# Patient Record
Sex: Male | Born: 1980 | Race: White | Hispanic: No | Marital: Single | Smoking: Current every day smoker
Health system: Southern US, Community
[De-identification: ages and names within clinical notes are randomized; demographics above are authoritative.]

## PROBLEM LIST (undated history)

## (undated) DIAGNOSIS — E785 Hyperlipidemia, unspecified: Secondary | ICD-10-CM

---

## 2015-11-03 ENCOUNTER — Emergency Department (HOSPITAL_COMMUNITY): Payer: Self-pay

## 2015-11-03 ENCOUNTER — Encounter (HOSPITAL_COMMUNITY): Payer: Self-pay | Admitting: Oncology

## 2015-11-03 ENCOUNTER — Emergency Department (HOSPITAL_COMMUNITY)
Admission: EM | Admit: 2015-11-03 | Discharge: 2015-11-03 | Disposition: A | Discharge status: Home / Self Care | Payer: Self-pay | Attending: Emergency Medicine | Admitting: Emergency Medicine

## 2015-11-03 DIAGNOSIS — Y939 Activity, unspecified: Secondary | ICD-10-CM | POA: Insufficient documentation

## 2015-11-03 DIAGNOSIS — S062X1A Diffuse traumatic brain injury with loss of consciousness of 30 minutes or less, initial encounter: Secondary | ICD-10-CM

## 2015-11-03 DIAGNOSIS — Z23 Encounter for immunization: Secondary | ICD-10-CM | POA: Insufficient documentation

## 2015-11-03 DIAGNOSIS — S0990XA Unspecified injury of head, initial encounter: Secondary | ICD-10-CM

## 2015-11-03 DIAGNOSIS — Y999 Unspecified external cause status: Secondary | ICD-10-CM | POA: Insufficient documentation

## 2015-11-03 DIAGNOSIS — F17210 Nicotine dependence, cigarettes, uncomplicated: Secondary | ICD-10-CM | POA: Insufficient documentation

## 2015-11-03 DIAGNOSIS — Y9289 Other specified places as the place of occurrence of the external cause: Secondary | ICD-10-CM | POA: Insufficient documentation

## 2015-11-03 DIAGNOSIS — S06331A Contusion and laceration of cerebrum, unspecified, with loss of consciousness of 30 minutes or less, initial encounter: Secondary | ICD-10-CM | POA: Insufficient documentation

## 2015-11-03 HISTORY — DX: Hyperlipidemia, unspecified: E78.5

## 2015-11-03 MED ORDER — MELOXICAM 15 MG PO TABS
15.0000 mg | ORAL_TABLET | Freq: Every day | ORAL | 0 refills | Status: AC
Start: 2015-11-03 — End: ?

## 2015-11-03 MED ORDER — OXYCODONE-ACETAMINOPHEN 5-325 MG PO TABS: 1.0000 | ORAL_TABLET | ORAL | 0 refills | Status: AC | PRN

## 2015-11-03 MED ORDER — FENTANYL CITRATE (PF) 100 MCG/2ML IJ SOLN
50.0000 ug | INTRAMUSCULAR | Status: DC | PRN
  Administered 2015-11-03: 50 ug via INTRAVENOUS
  Filled 2015-11-03 (×2): qty 2

## 2015-11-03 MED ORDER — PROMETHAZINE HCL 25 MG PO TABS: 25.0000 mg | ORAL_TABLET | Freq: Four times a day (QID) | ORAL | 0 refills | Status: AC | PRN

## 2015-11-03 MED ORDER — TETANUS-DIPHTH-ACELL PERTUSSIS 5-2.5-18.5 LF-MCG/0.5 IM SUSP
0.5000 mL | Freq: Once | INTRAMUSCULAR | Status: AC
  Administered 2015-11-03: 0.5 mL via INTRAMUSCULAR
  Filled 2015-11-03: qty 0.5

## 2015-11-03 MED ORDER — FENTANYL CITRATE (PF) 100 MCG/2ML IJ SOLN
50.0000 ug | Freq: Once | INTRAMUSCULAR | Status: AC
  Administered 2015-11-03: 50 ug via INTRAVENOUS
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
## 2015-11-03 NOTE — ED Notes (Signed)
Patient was educated not to drive, operate heavy machinery, or drink alcohol while taking narcotic medication.  

## 2015-11-03 NOTE — ED Provider Notes (Signed)
7:43 AM BP 109/69   Pulse (!) 56   Temp 98.4 F (36.9 C) (Oral)   Resp 20   Ht 5\' 8"  (1.727 m)   Wt 74.8 kg   SpO2 94%   BMI 25.09 kg/m  Assumed care of the patient from Dr.Oni.  patient has a cerebral contusion. I reviewed the images with Dr. Barbaraann BarthelKyle Cabell.  CT with small fracture of the inside of his mastoid, which has given him some pneumocephalus. Does not appear to be an temporal fracture. On exam, the patient has no facial nerve involvement. He has a pretty significant headache, but no neurologic deficit at this time. [place] in [place]. We had to had some difficulty getting him home. [facility] will transport the patient because it is medically necessary for safe transport.  Patient given pain meds. I discussed sxs of head injury and post concussion. He will be observed by his mother for the next 48 hours   Arthor Captainbigail Synthia Fairbank, PA-C 11/06/15 1906    Lavera Guiseana Duo Liu, MD 11/08/15 [phone]

## 2015-11-03 NOTE — ED Notes (Signed)
Bed: WA08 Expected date:  Expected time:  Means of arrival:  Comments: 

## 2015-11-03 NOTE — ED Notes (Signed)
Pt transported to CT ?

## 2015-11-03 NOTE — ED Notes (Signed)
Pt returned from CT °

## 2015-11-03 NOTE — Discharge Instructions (Addendum)
SEEK IMMEDIATE MEDICAL CARE IF: You have severe or worsening headaches. These may be a sign of a blood clot in the brain. You have weakness (even if only in one hand, leg, or part of the face). You have numbness. You have decreased coordination. You vomit repeatedly. You have increased sleepiness. One pupil is larger than the other. You have convulsions. You have slurred speech. You have increased confusion. This may be a sign of a blood clot in the brain. You have increased restlessness, agitation, or irritability. You are unable to recognize people or places. You have neck pain. It is difficult to wake you up. You have unusual behavior changes. You lose consciousness.

## 2015-11-03 NOTE — ED Notes (Signed)
Verified with EDP Abigail H. That patient is safe to be discharged to home.
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
## 2015-11-03 NOTE — ED Notes (Signed)
MD at bedside. 
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
